# Patient Record
Sex: Male | Born: 2000 | Race: Black or African American | Hispanic: No | Marital: Single
Health system: Southern US, Community
[De-identification: ages and names within clinical notes are randomized; demographics above are authoritative.]

## PROBLEM LIST (undated history)

## (undated) DIAGNOSIS — Z9109 Other allergy status, other than to drugs and biological substances: Secondary | ICD-10-CM

---
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
## 2023-10-24 ENCOUNTER — Encounter (HOSPITAL_COMMUNITY): Payer: Self-pay

## 2023-10-24 ENCOUNTER — Other Ambulatory Visit: Payer: Self-pay

## 2023-10-24 ENCOUNTER — Emergency Department (HOSPITAL_COMMUNITY)
Admission: EM | Admit: 2023-10-24 | Discharge: 2023-10-24 | Disposition: A | Discharge status: Home / Self Care | Payer: Medicaid Other | Attending: Emergency Medicine | Admitting: Emergency Medicine

## 2023-10-24 DIAGNOSIS — J09X2 Influenza due to identified novel influenza A virus with other respiratory manifestations: Secondary | ICD-10-CM | POA: Insufficient documentation

## 2023-10-24 DIAGNOSIS — J111 Influenza due to unidentified influenza virus with other respiratory manifestations: Secondary | ICD-10-CM

## 2023-10-24 DIAGNOSIS — Z20822 Contact with and (suspected) exposure to covid-19: Secondary | ICD-10-CM | POA: Diagnosis not present

## 2023-10-24 DIAGNOSIS — J029 Acute pharyngitis, unspecified: Secondary | ICD-10-CM | POA: Diagnosis present

## 2023-10-24 LAB — RESP PANEL BY RT-PCR (RSV, FLU A&B, COVID)  RVPGX2
Influenza A by PCR: POSITIVE — AB
Influenza B by PCR: NEGATIVE
Resp Syncytial Virus by PCR: NEGATIVE
SARS Coronavirus 2 by RT PCR: NEGATIVE

## 2023-10-24 MED ORDER — IBUPROFEN 400 MG PO TABS
600.0000 mg | ORAL_TABLET | Freq: Once | ORAL | Status: AC
  Administered 2023-10-24: 600 mg via ORAL
  Filled 2023-10-24: qty 1

## 2023-10-24 NOTE — ED Provider Notes (Signed)
[place] EMERGENCY DEPARTMENT AT [facility] Provider Note   CSN: [id number] Arrival date & time: 10/24/23  1108     History  Chief Complaint  Patient presents with   Hiccups   HPI Shawn Powell is a 23 y.o. male presenting for nasal congestion, cough sore throat and headache.  Symptoms started on Thursday.  States he also had some nausea and vomiting on Friday and complains of hiccups.  Unsure if he has had a fever.  Denies chest pain or shortness of breath.  Has not taken any medications for symptoms.  HPI     Home Medications Prior to Admission medications   Medication Sig Start Date End Date Taking? Authorizing Provider  acetaminophen (TYLENOL) 500 MG tablet Take 1,000 mg by mouth every 6 (six) hours as needed for headache.    [provider]  omeprazole (PRILOSEC) 20 MG capsule Take 1 capsule (20 mg total) by mouth daily for 14 days. 08/18/21 09/01/21  Sponseller, Eugene Gavia, PA-C      Allergies    Patient has no known allergies.    Review of Systems   See HPI for pertinent positive  Physical Exam Updated Vital Signs BP 137/84 (BP Location: Right Arm)   Pulse 80   Temp 98.9 F (37.2 C)   Resp 16   Ht 5' 6.5" (1.689 m)   Wt 60.3 kg   SpO2 100%   BMI 21.15 kg/m  Physical Exam Vitals and nursing note reviewed.  HENT:     Head: Normocephalic and atraumatic.     Mouth/Throat:     Mouth: Mucous membranes are moist.  Eyes:     General:        Right eye: No discharge.        Left eye: No discharge.     Conjunctiva/sclera: Conjunctivae normal.  Cardiovascular:     Rate and Rhythm: Normal rate and regular rhythm.     Pulses: Normal pulses.     Heart sounds: Normal heart sounds.  Pulmonary:     Effort: Pulmonary effort is normal. No respiratory distress.     Breath sounds: Normal breath sounds. No wheezing, rhonchi or rales.  Abdominal:     General: Abdomen is flat.     Palpations: Abdomen is soft.  Skin:    General: Skin is  warm and dry.  Neurological:     General: No focal deficit present.  Psychiatric:        Mood and Affect: Mood normal.     ED Results / Procedures / Treatments   Labs (all labs ordered are listed, but only abnormal results are displayed) Labs Reviewed  RESP PANEL BY RT-PCR (RSV, FLU A&B, COVID)  [id number] - Abnormal; Notable for the following components:      Result Value   Influenza A by PCR POSITIVE (*)    All other components within normal limits    EKG None  Radiology No results found.  Procedures Procedures    Medications Ordered in ED Medications  ibuprofen (ADVIL) tablet 600 mg (has no administration in time range)    ED Course/ Medical Decision Making/ A&P Clinical Course as of 10/24/23 1347  Sun Oct 24, 2023  1304 SARS Coronavirus 2 by RT PCR: NEGATIVE [JR]  1304 Influenza A By PCR(!): POSITIVE [JR]    Clinical Course User Index [JR] Gareth Eagle, PA-C  Medical Decision Making Amount and/or Complexity of Data Reviewed Labs:  Decision-making details documented in ED Course.  23 year old well-appearing male presenting for URI symptoms and hiccups.  Exam was unremarkable.  No observable chest hiccups during this encounter and on reassessment patient stated that they resolved.  Respiratory PCR was positive for the flu.  This likely explains his symptoms.  Overall looks well and nontoxic.  Advised supportive treatment at home.  Discussed return precautions.  But also advised to follow-up PCP if symptoms persisted.  Discharged in good condition.  Provided a work note.         Final Clinical Impression(s) / ED Diagnoses Final diagnoses:  Flu    Rx / DC Orders ED Discharge Orders     None         Gareth Eagle, PA-C 10/24/23 1347    Gwyneth Sprout, MD 10/25/23 1502

## 2023-10-24 NOTE — Discharge Instructions (Addendum)
Evaluation today revealed you have the flu.  This likely explains your symptoms.  Treatment is supportive at home which includes rest, hydration with water and Gatorade, and you can take ibuprofen Tylenol for symptomatic relief and fever control.  If you become short of breath, chest pain, have lethargy or uncontrolled fever or any other concerning symptom please return to the emergency department further evaluation.  Otherwise recommend you follow-up with your PCP.

## 2023-10-24 NOTE — ED Triage Notes (Signed)
Patient reports Friday had headache n/v.  Reports nasal congestion and headache since.  Also complains of nonstop hiccups.  No hiccups observed in triage.

## 2023-10-24 NOTE — ED Provider Triage Note (Signed)
Emergency Medicine Provider Triage Evaluation Note  Shawn Powell , a 23 y.o. male  was evaluated in triage.  Pt complains of cough, congestion, hiccups x3 days. Short of breath when having hiccups. Denies sick contacts.   Endorses sore throat  Denies fevers, headaches, chest pain, abdominal pain, n/v/d  Review of Systems  Positive: See above Negative: See above  Physical Exam  BP 137/84 (BP Location: Right Arm)   Pulse 80   Temp 98.9 F (37.2 C)   Resp 16   Ht 5' 6.5" (1.689 m)   Wt 60.3 kg   SpO2 100%   BMI 21.15 kg/m  Gen:   Awake, no distress   Resp:  Normal effort  MSK:   Moves extremities without difficulty  Other:    Medical Decision Making  Medically screening exam initiated at 11:27 AM.  Appropriate orders placed.  Shawn Powell was informed that the remainder of the evaluation will be completed by another provider, this initial triage assessment does not replace that evaluation, and the importance of remaining in the ED until their evaluation is complete.     Lunette Stands, [place] 10/24/23 1129
